# Patient Record
Sex: Male | Born: 1990 | Race: White | Hispanic: No | Marital: Married | Smoking: Never smoker
Health system: Southern US, Community
[De-identification: ages and names within clinical notes are randomized; demographics above are authoritative.]

## PROBLEM LIST (undated history)

## (undated) DIAGNOSIS — J45909 Unspecified asthma, uncomplicated: Secondary | ICD-10-CM

---

## 2017-09-23 ENCOUNTER — Emergency Department (INDEPENDENT_AMBULATORY_CARE_PROVIDER_SITE_OTHER): Payer: 59

## 2017-09-23 ENCOUNTER — Encounter: Payer: Self-pay | Admitting: *Deleted

## 2017-09-23 ENCOUNTER — Emergency Department: Payer: 59

## 2017-09-23 ENCOUNTER — Emergency Department (INDEPENDENT_AMBULATORY_CARE_PROVIDER_SITE_OTHER)
Admission: EM | Admit: 2017-09-23 | Discharge: 2017-09-23 | Disposition: A | Discharge status: Home / Self Care | Payer: 59 | Source: Home / Self Care | Attending: Family Medicine | Admitting: Family Medicine

## 2017-09-23 ENCOUNTER — Other Ambulatory Visit: Payer: Self-pay

## 2017-09-23 DIAGNOSIS — M79661 Pain in right lower leg: Secondary | ICD-10-CM

## 2017-09-23 DIAGNOSIS — S8251XA Displaced fracture of medial malleolus of right tibia, initial encounter for closed fracture: Secondary | ICD-10-CM

## 2017-09-23 DIAGNOSIS — X58XXXA Exposure to other specified factors, initial encounter: Secondary | ICD-10-CM

## 2017-09-23 HISTORY — DX: Unspecified asthma, uncomplicated: J45.909

## 2017-09-23 NOTE — Discharge Instructions (Signed)
°  You may take 500mg  acetaminophen every 4-6 hours or in combination with ibuprofen 400-600mg  every 6-8 hours as needed for pain and inflammation.  You may remove the boot during the day to apply a cool compress 2-3 times daily and to bath.  Please follow up with Family Medicine or Sports Medicine in 2-3 weeks if not improving, sooner if worsening.

## 2017-09-23 NOTE — ED Triage Notes (Signed)
Pt c/o RT lower leg pain x 2 days after kicking a football. He applied ice.

## 2017-09-23 NOTE — ED Provider Notes (Signed)
[facility]    CSN: [id number] Arrival date & time: 09/23/17  [id number]     History   Chief Complaint Chief Complaint  Patient presents with  . Leg Pain    HPI Sean Russo is a 27 y.o. male.   HPI  Sean Russo is a 27 y.o. male presenting to UC with c/o Right anterior lower leg pain and Right ankle pain that started 2 days ago after kicking a football. He believes he twisted his leg some during the kick.  Pain is aching and sore, worse with ambulation.  He applied ice with mild temporary relief.  He has not taken any pain medications. No other injuries.    Past Medical History:  Diagnosis Date  . Asthma     There are no active problems to display for this patient.   History reviewed. No pertinent surgical history.     Home Medications    Prior to Admission medications   Medication Sig Start Date End Date Taking? Authorizing Provider  albuterol (PROAIR HFA) 108 (90 Base) MCG/ACT inhaler Inhale into the lungs. 09/18/16  Yes [provider]    Family History History reviewed. No pertinent family history.  Social History Social History   Tobacco Use  . Smoking status: Never Smoker  . Smokeless tobacco: Never Used  Substance Use Topics  . Alcohol use: Never    Frequency: Never  . Drug use: Never     Allergies   Shellfish allergy; Amoxicillin; and Sulfa antibiotics   Review of Systems Review of Systems  Musculoskeletal: Positive for arthralgias and myalgias. Negative for gait problem and joint swelling.  Skin: Negative for color change and wound.  Neurological: Negative for weakness and numbness.     Physical Exam Triage Vital Signs ED Triage Vitals  Enc Vitals Group     BP 09/23/17 0844 (!) 146/74     Pulse Rate 09/23/17 0844 (!) 46     Resp 09/23/17 0844 16     Temp 09/23/17 0844 98.3 F (36.8 C)     Temp Source 09/23/17 0844 Oral     SpO2 09/23/17 0844 100 %     Weight 09/23/17 0845 182 lb (82.6 kg)     Height  09/23/17 0845 5\' 8"  (1.727 m)     Head Circumference --      Peak Flow --      Pain Score 09/23/17 0845 4     Pain Loc --      Pain Edu? --      Excl. in GC? --    No data found.  Updated Vital Signs BP (!) 146/74 (BP Location: Right Arm)   Pulse (!) 46   Temp 98.3 F (36.8 C) (Oral)   Resp 16   Ht 5\' 8"  (1.727 m)   Wt 182 lb (82.6 kg)   SpO2 100%   BMI 27.67 kg/m   Visual Acuity Right Eye Distance:   Left Eye Distance:   Bilateral Distance:    Right Eye Near:   Left Eye Near:    Bilateral Near:     Physical Exam  Constitutional: He is oriented to person, place, and time. He appears well-developed and well-nourished.  HENT:  Head: Normocephalic and atraumatic.  Eyes: EOM are normal.  Neck: Normal range of motion.  Cardiovascular: Bradycardia present.  Pulmonary/Chest: Effort normal. No respiratory distress.  Musculoskeletal: Normal range of motion. He exhibits tenderness. He exhibits no edema.  Right leg: no obvious edema. Full ROM knee  and ankle. Tenderness to anterior proximal anterior tibialis muscle. Calf is soft, non-tender. Right ankle: mild tenderness to lateral aspect. Full ROM  Neurological: He is alert and oriented to person, place, and time.  Skin: Skin is warm and dry.  Right leg: skin in tact. No ecchymosis or erythema.  Psychiatric: He has a normal mood and affect. His behavior is normal.  Nursing note and vitals reviewed.    UC Treatments / Results  Labs (all labs ordered are listed, but only abnormal results are displayed) Labs Reviewed - No data to display  EKG None  Radiology Dg Tibia/fibula Right  Result Date: 09/23/2017 CLINICAL DATA:  Right lower leg and ankle pain and tenderness after kicking football 2 days ago EXAM: RIGHT TIBIA AND FIBULA - 2 VIEW COMPARISON:  None. FINDINGS: There is a small bone fragment noted adjacent to the medial malleolus, likely small avulsed fragment. No additional acute bony abnormality. No subluxation or  dislocation. IMPRESSION: Avulsion fracture off the tip of the medial malleolus. Electronically Signed   By: Charlett Nose M.D.   On: 09/23/2017 09:41   Dg Ankle Complete Right  Result Date: 09/23/2017 CLINICAL DATA:  Right lower leg pain EXAM: RIGHT ANKLE - COMPLETE 3+ VIEW COMPARISON:  None. FINDINGS: Irregular bone fragment noted adjacent to the medial malleolus, compatible with small avulsed fragment. No additional acute bony abnormality. No subluxation or dislocation. IMPRESSION: Small avulsed fragment at the tip of the medial malleolus. Electronically Signed   By: Charlett Nose M.D.   On: 09/23/2017 09:40   US Venous Img Lower Unilateral Right  Result Date: 09/23/2017 CLINICAL DATA:  Leg pain following kicking football 2 days ago, initial encounter EXAM: RIGHT LOWER EXTREMITY VENOUS DOPPLER ULTRASOUND TECHNIQUE: Gray-scale sonography with graded compression, as well as color Doppler and duplex ultrasound were performed to evaluate the lower extremity deep venous systems from the level of the common femoral vein and including the common femoral, femoral, profunda femoral, popliteal and calf veins including the posterior tibial, peroneal and gastrocnemius veins when visible. The superficial great saphenous vein was also interrogated. Spectral Doppler was utilized to evaluate flow at rest and with distal augmentation maneuvers in the common femoral, femoral and popliteal veins. COMPARISON:  None. FINDINGS: Contralateral Common Femoral Vein: Respiratory phasicity is normal and symmetric with the symptomatic side. No evidence of thrombus. Normal compressibility. Common Femoral Vein: No evidence of thrombus. Normal compressibility, respiratory phasicity and response to augmentation. Saphenofemoral Junction: No evidence of thrombus. Normal compressibility and flow on color Doppler imaging. Profunda Femoral Vein: No evidence of thrombus. Normal compressibility and flow on color Doppler imaging. Femoral Vein: No  evidence of thrombus. Normal compressibility, respiratory phasicity and response to augmentation. Popliteal Vein: No evidence of thrombus. Normal compressibility, respiratory phasicity and response to augmentation. Calf Veins: No evidence of thrombus. Normal compressibility and flow on color Doppler imaging. Superficial Great Saphenous Vein: No evidence of thrombus. Normal compressibility. Venous Reflux:  None. Other Findings:  None. IMPRESSION: No evidence of deep venous thrombosis. Electronically Signed   By: Alcide Clever M.D.   On: 09/23/2017 09:33    Procedures Procedures (including critical care time)  Medications Ordered in UC Medications - No data to display  Initial Impression / Assessment and Plan / UC Course  I have reviewed the triage vital signs and the nursing notes.  Pertinent labs & imaging results that were available during my care of the patient were reviewed by me and considered in my medical decision making (see chart  for details).     Hx and exam c/w closed avulsion fracture of medial malleolus  Walking boot applied, crutches provided for comfort. Home instructions provided.   Final Clinical Impressions(s) / UC Diagnoses   Final diagnoses:  Avulsion fracture of medial malleolus of right tibia, closed, initial encounter  Pain in right shin     Discharge Instructions      You may take 500mg  acetaminophen every 4-6 hours or in combination with ibuprofen 400-600mg  every 6-8 hours as needed for pain and inflammation.  You may remove the boot during the day to apply a cool compress 2-3 times daily and to bath.  Please follow up with Family Medicine or Sports Medicine in 2-3 weeks if not improving, sooner if worsening.    ED Prescriptions    None     Controlled Substance Prescriptions [place] Controlled Substance Registry consulted? Not Applicable   Rolla Plate 09/23/17 1005

## 2018-10-17 IMAGING — US US EXTREM LOW VENOUS*R*
1 series · 13 of 24 positions shown · non-contrast
Comparison: None.

CLINICAL DATA: Leg pain following kicking football 2 days ago,
initial encounter



[Series 1: us extrem low venous*right* · 0.06mm/px · 13 of 28 slices shown]
[im 1/28]
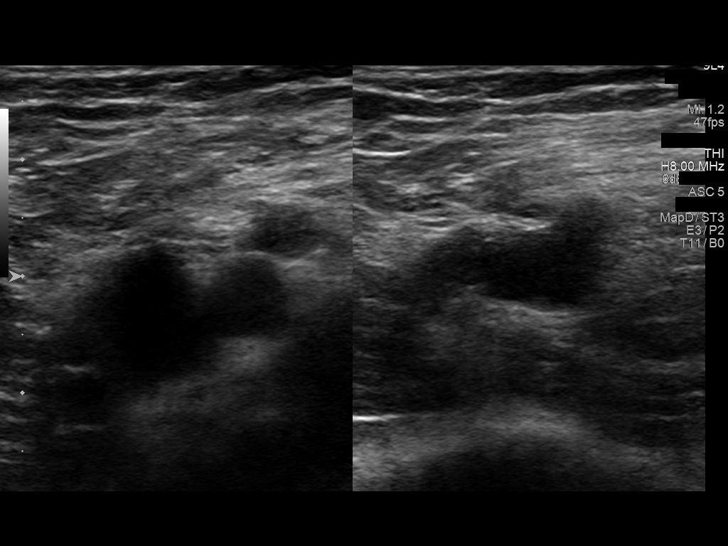
[im 3/28]
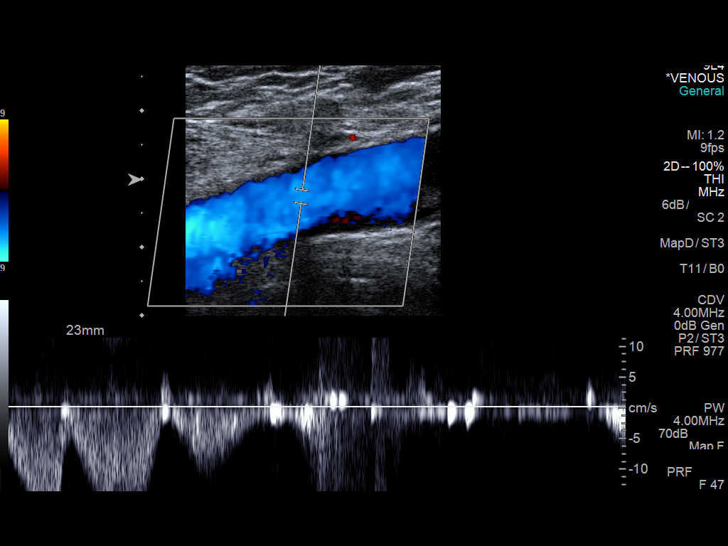
[im 5/28]
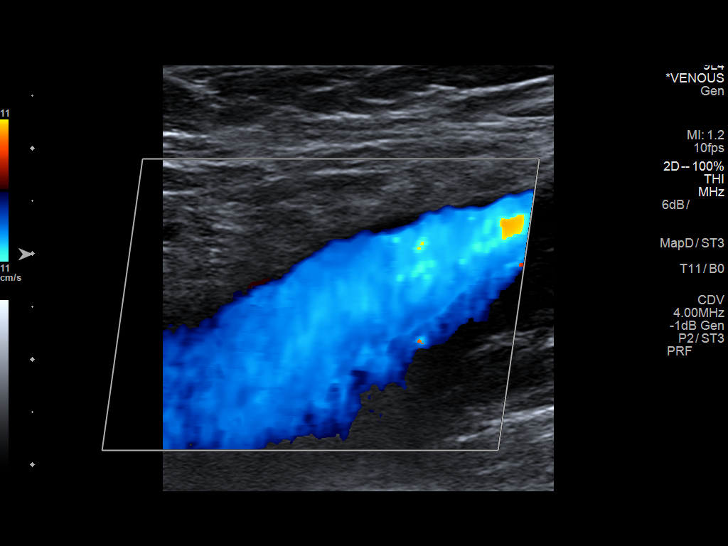
[im 8/28]
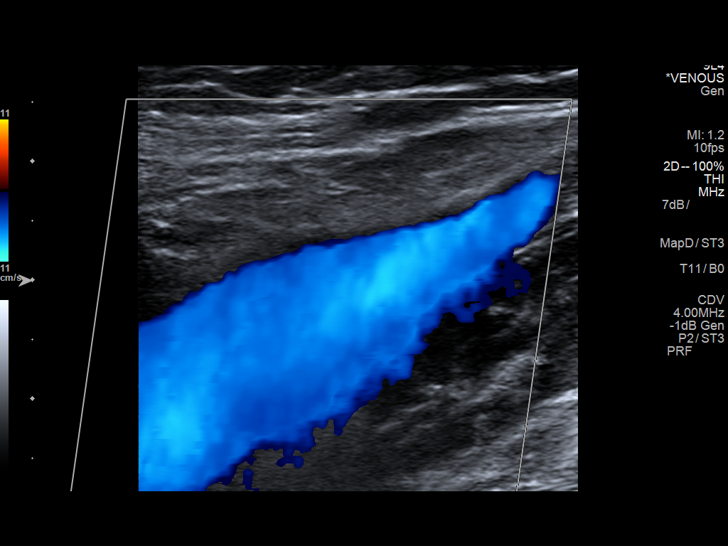
[im 10/28]
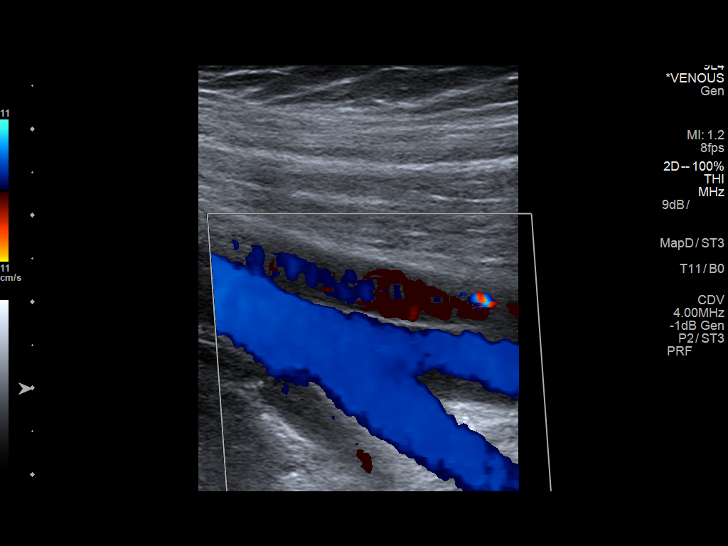
[im 12/28]
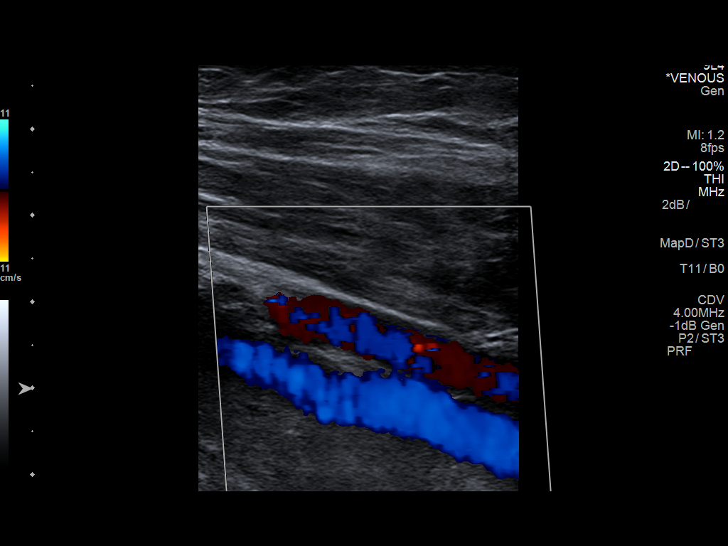
[im 15/28]
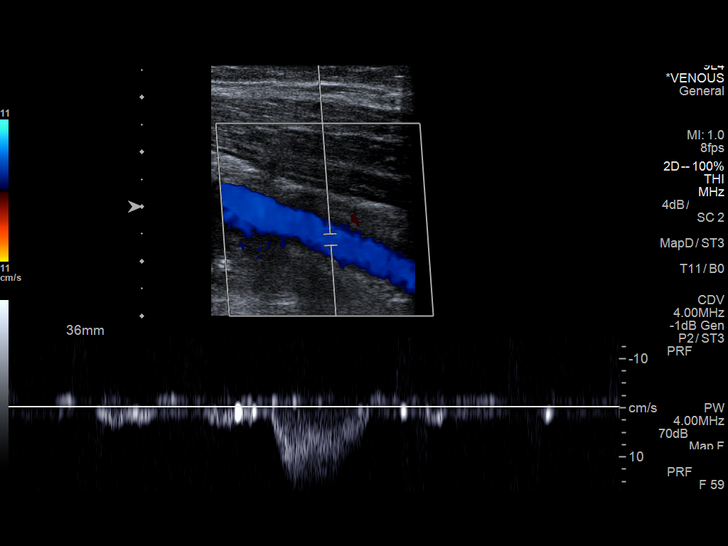
[im 16/28]
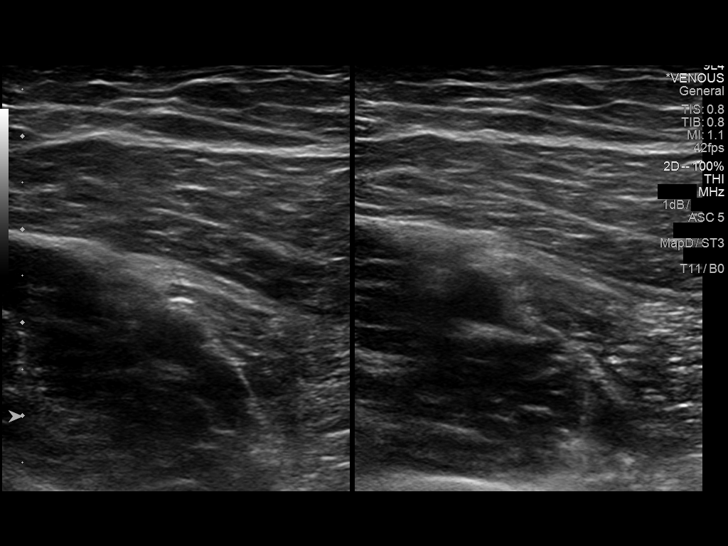
[im 18/28]
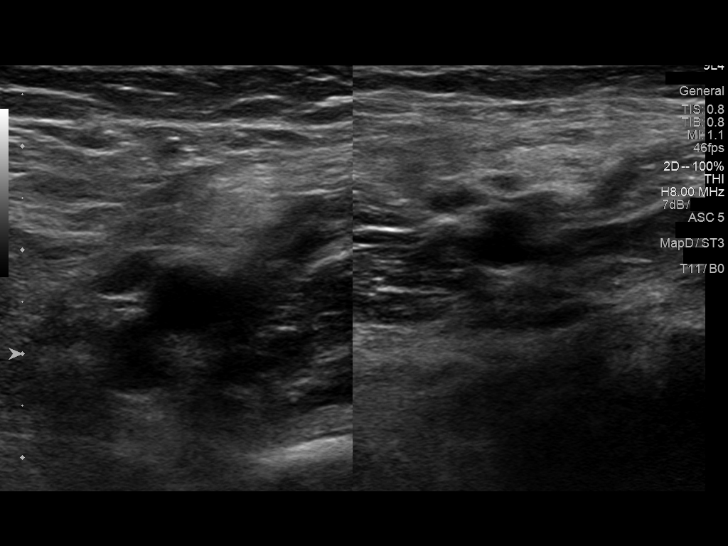
[im 20/28]
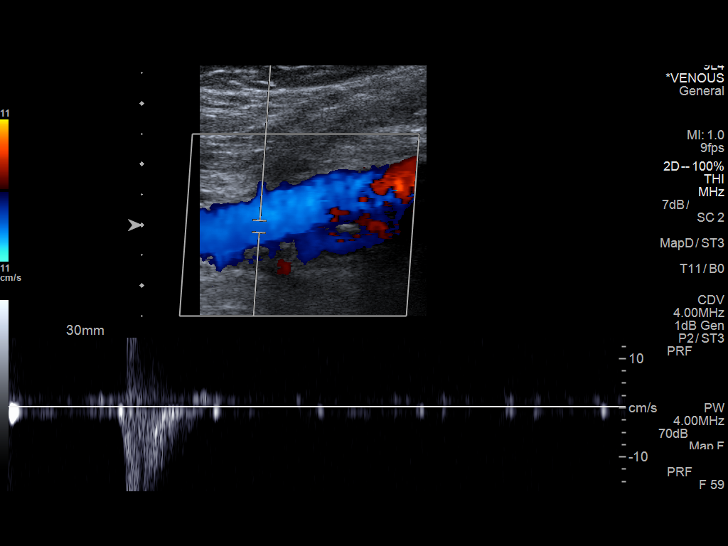
[im 23/28]
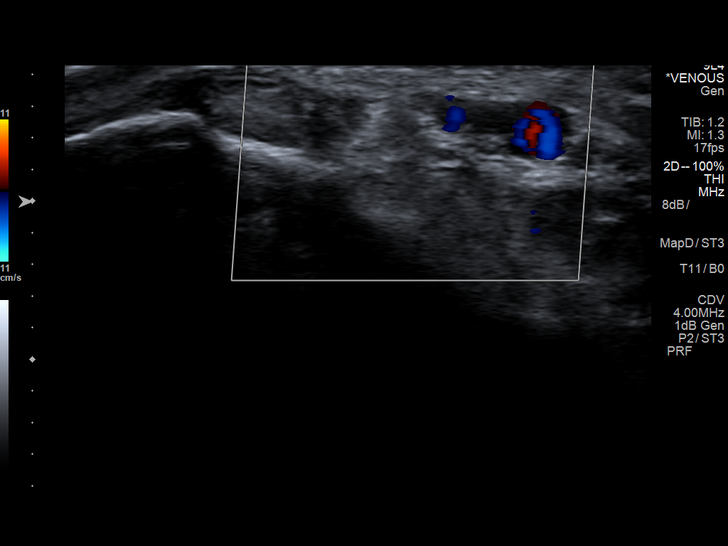
[im 25/28]
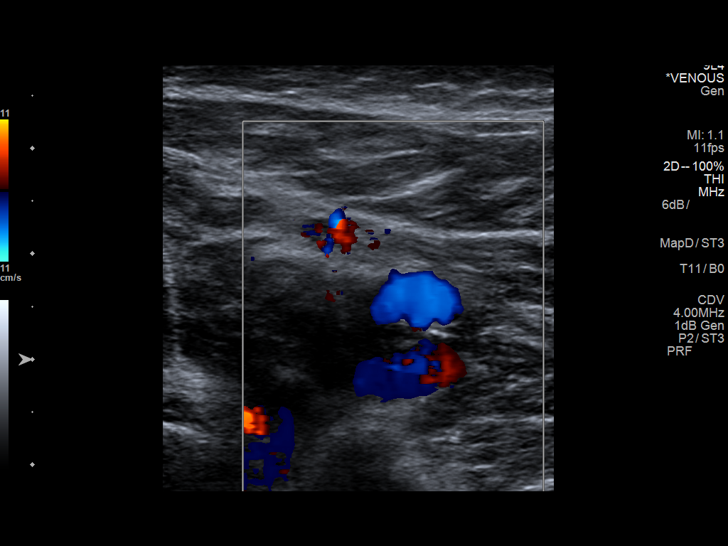
[im 28/28]
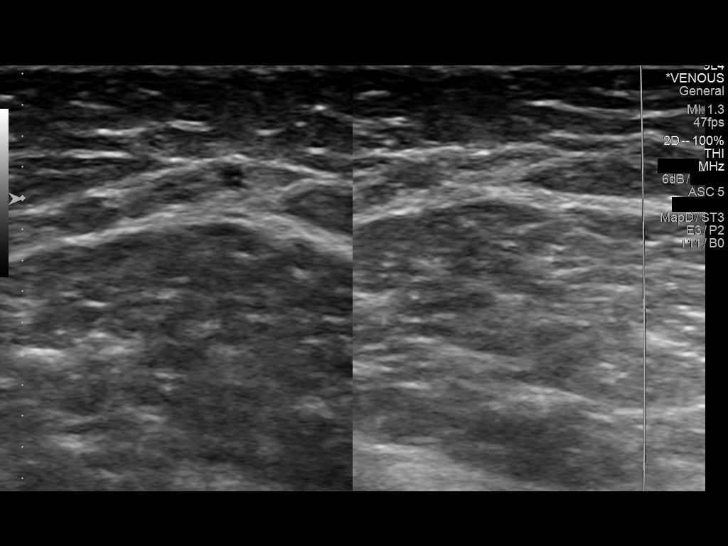

[13 of 24 positions shown; findings below may reference images not displayed]

FINDINGS: Contralateral Common Femoral Vein: Respiratory phasicity is normal
and symmetric with the symptomatic side. No evidence of thrombus.
Normal compressibility.

Common Femoral Vein: No evidence of thrombus. Normal
compressibility, respiratory phasicity and response to augmentation.

Saphenofemoral Junction: No evidence of thrombus. Normal
compressibility and flow on color Doppler imaging.

Profunda Femoral Vein: No evidence of thrombus. Normal
compressibility and flow on color Doppler imaging.

Femoral Vein: No evidence of thrombus. Normal compressibility,
respiratory phasicity and response to augmentation.

Popliteal Vein: No evidence of thrombus. Normal compressibility,
respiratory phasicity and response to augmentation.

Calf Veins: No evidence of thrombus. Normal compressibility and flow
on color Doppler imaging.

Superficial Great Saphenous Vein: No evidence of thrombus. Normal
compressibility.

Venous Reflux:  None.

Other Findings:  None.
IMPRESSION: No evidence of deep venous thrombosis.

## 2019-09-12 IMAGING — DX DG TIBIA/FIBULA 2V*R*
3 series · 3 of 3 positions shown · non-contrast
Comparison: None.

CLINICAL DATA: Right lower leg and ankle pain and tenderness after
kicking football 2 days ago

EXAM:
RIGHT TIBIA AND FIBULA - 2 VIEW

[ankle ap]
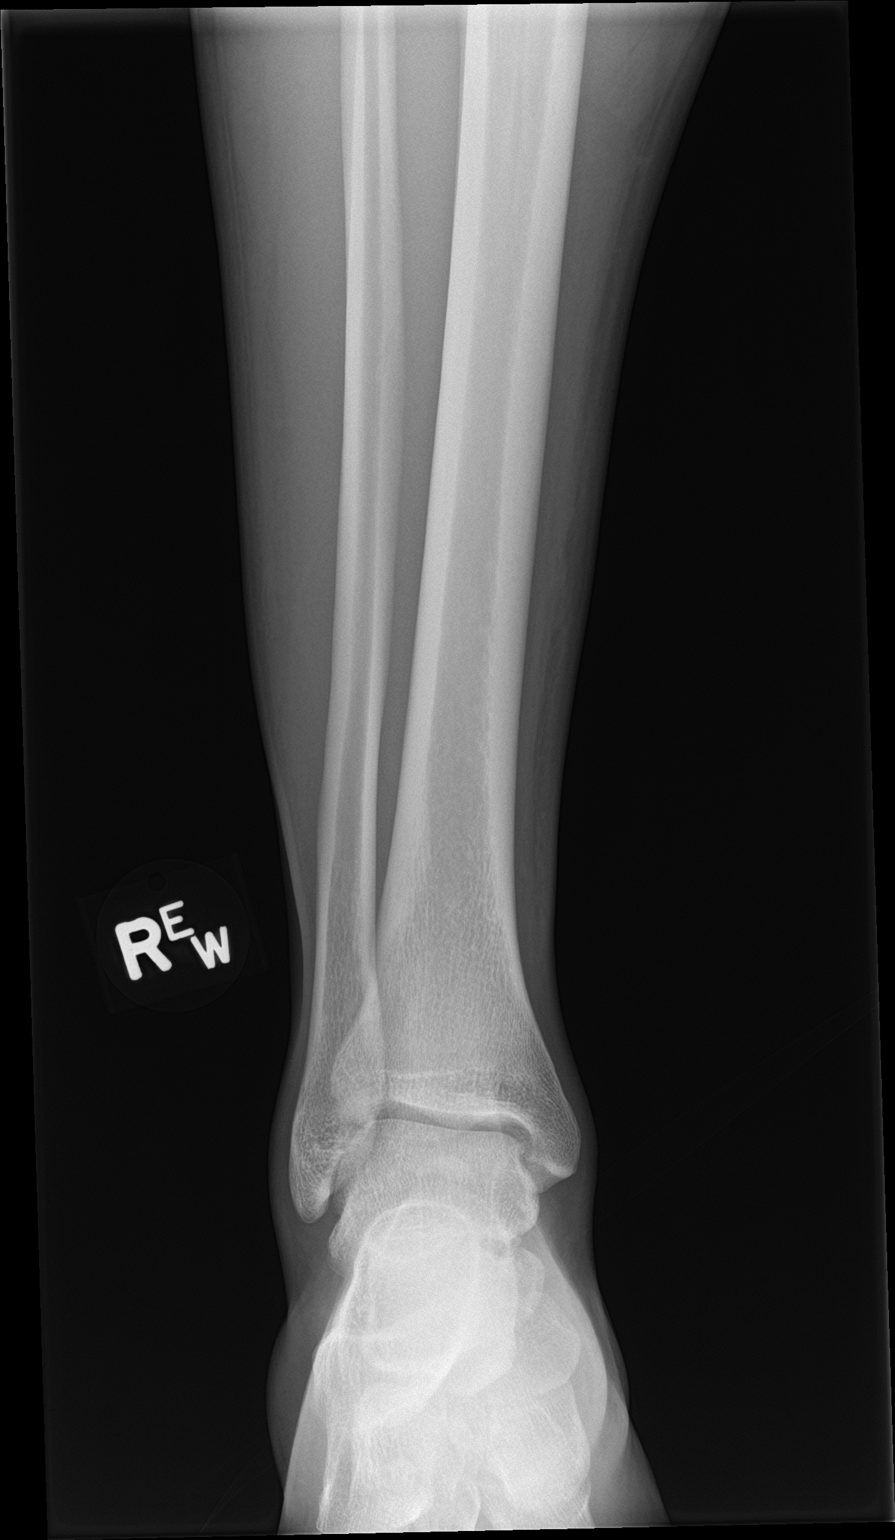

[ankle obl]
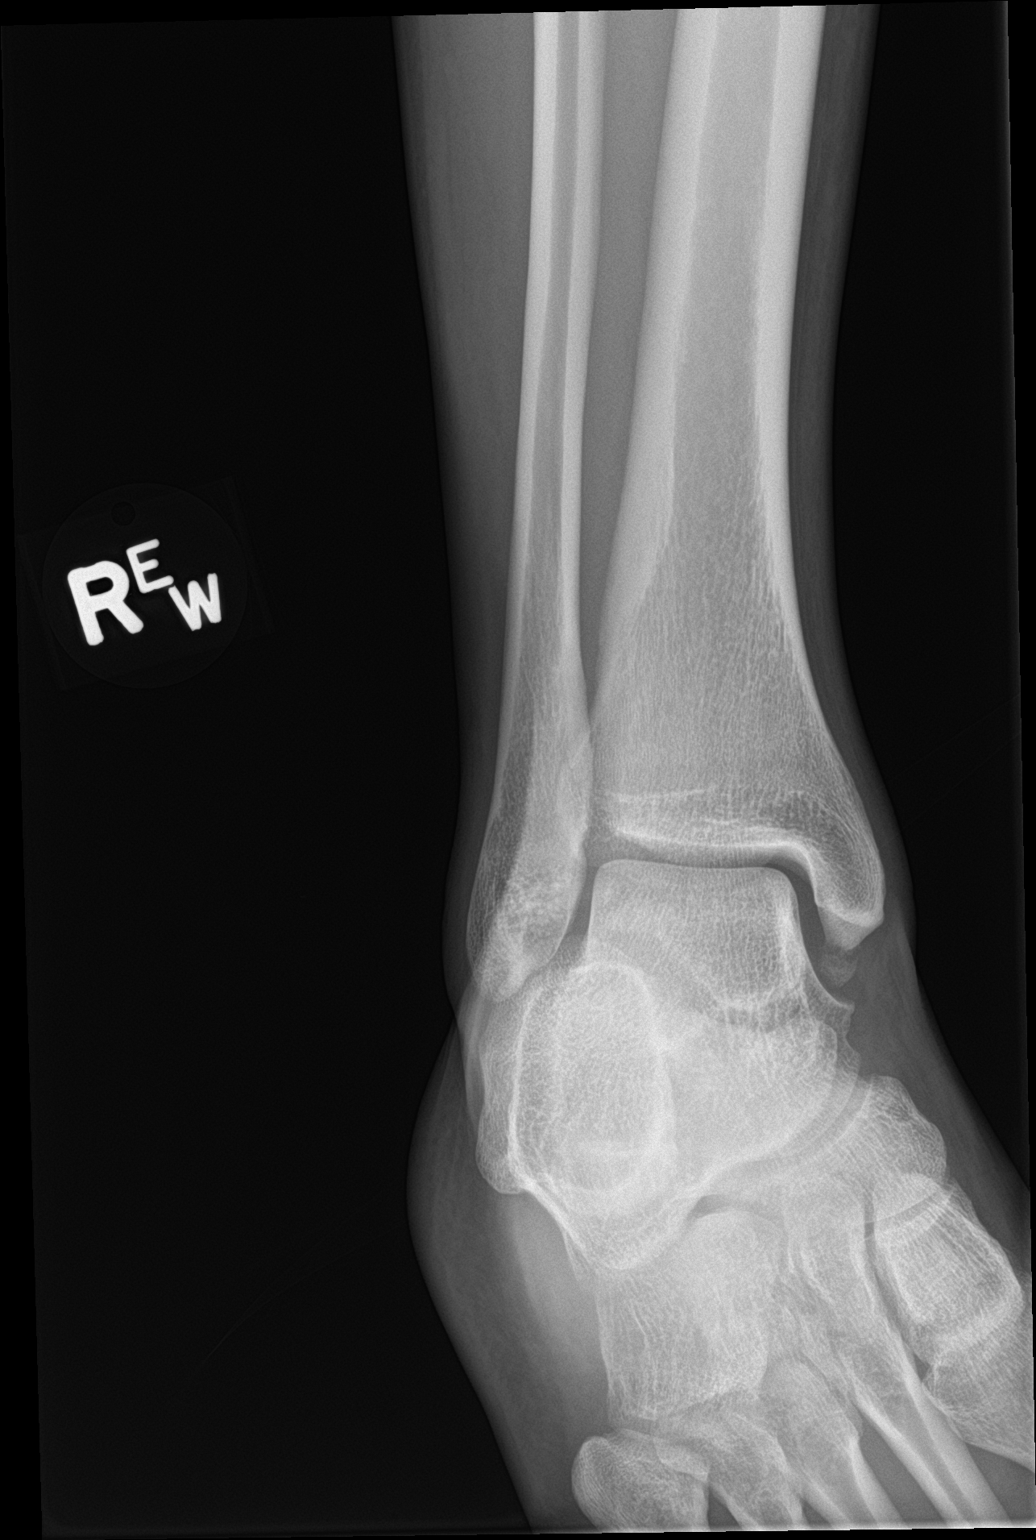

[ankle lat]
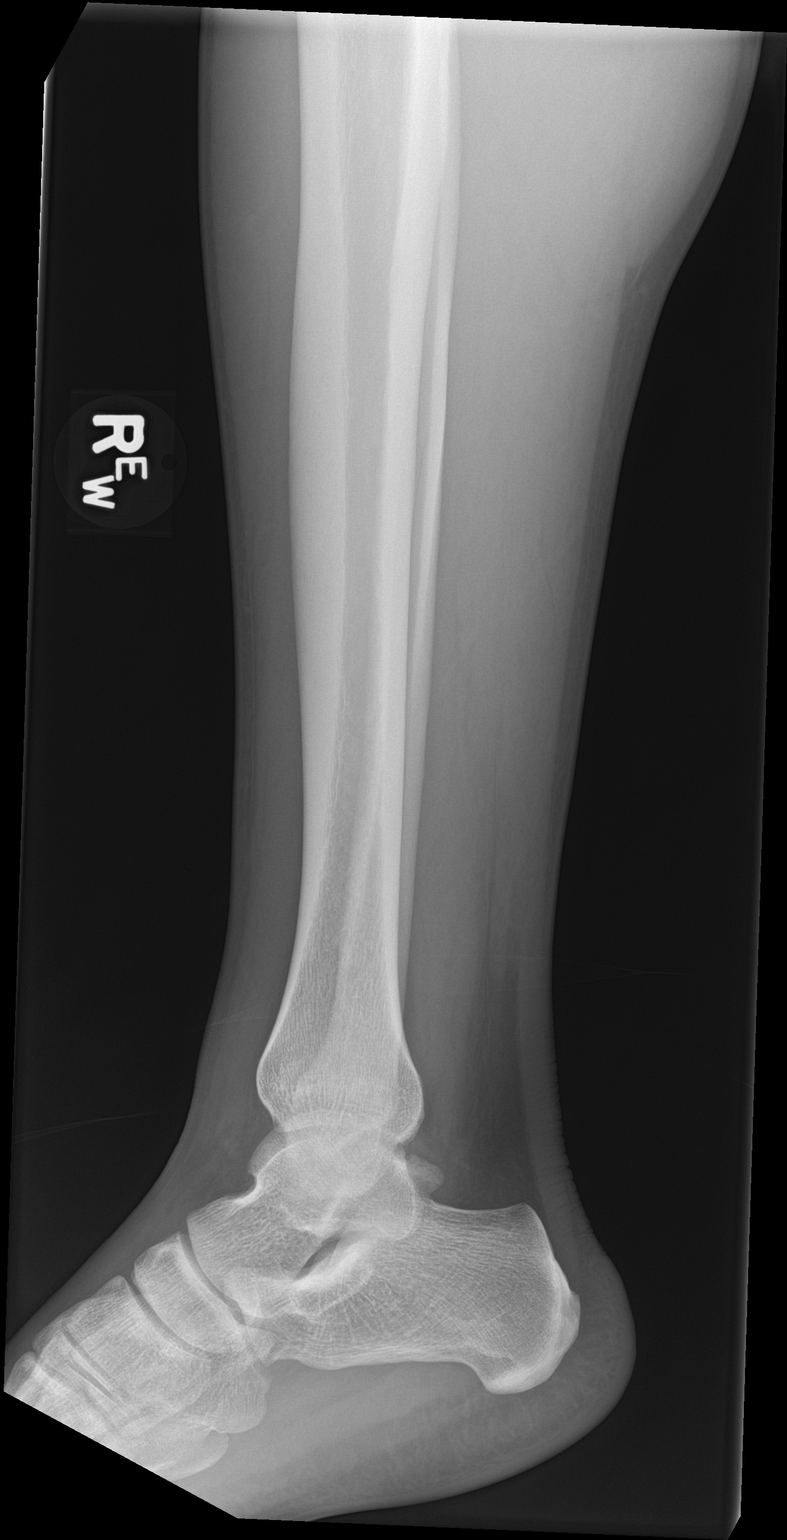

[3 of 3 positions shown; findings below may reference images not displayed]

FINDINGS: There is a small bone fragment noted adjacent to the medial
malleolus, likely small avulsed fragment. No additional acute bony
abnormality. No subluxation or dislocation.
IMPRESSION: Avulsion fracture off the tip of the medial malleolus.

## 2020-07-03 ENCOUNTER — Ambulatory Visit: Payer: 59 | Admitting: Podiatry

## 2020-07-04 ENCOUNTER — Encounter: Payer: Self-pay | Admitting: Podiatry

## 2020-07-04 ENCOUNTER — Other Ambulatory Visit: Payer: Self-pay

## 2020-07-04 ENCOUNTER — Ambulatory Visit: Payer: 59 | Admitting: Podiatry

## 2020-07-04 DIAGNOSIS — L600 Ingrowing nail: Secondary | ICD-10-CM | POA: Diagnosis not present

## 2020-07-05 ENCOUNTER — Encounter: Payer: Self-pay | Admitting: Podiatry

## 2020-07-05 NOTE — Progress Notes (Signed)
  Subjective:  Patient ID: Sean Russo, male    DOB: 1990/05/25,  MRN: [id number]  Chief Complaint  Patient presents with  . Ingrown Toenail    Left hallux ingrown     30 y.o. male presents with the above complaint.  Patient presents with complaint left hallux medial border ingrown.  Patient states is painful to touch.  Patient would like to have it removed.  He has not had an ingrown removed in the past.  He states that he is not allergic to Betadine.  He denies any other acute complaints.  He has not seen anyone else prior to seeing me.  He would like to discuss all the treatment options that are available to him.   Review of Systems: Negative except as noted in the HPI. Denies N/V/F/Ch.  Past Medical History:  Diagnosis Date  . Asthma     Current Outpatient Medications:  .  albuterol (PROAIR HFA) 108 (90 Base) MCG/ACT inhaler, Inhale into the lungs., Disp: , Rfl:   Social History   Tobacco Use  Smoking Status Never Smoker  Smokeless Tobacco Never Used    Allergies  Allergen Reactions  . Shellfish Allergy Itching and Rash  . Amoxicillin Rash  . Sulfa Antibiotics Other (See Comments)   Objective:  There were no vitals filed for this visit. There is no height or weight on file to calculate BMI. Constitutional Well developed. Well nourished.  Vascular Dorsalis pedis pulses palpable bilaterally. Posterior tibial pulses palpable bilaterally. Capillary refill normal to all digits.  No cyanosis or clubbing noted. Pedal hair growth normal.  Neurologic Normal speech. Oriented to person, place, and time. Epicritic sensation to light touch grossly present bilaterally.  Dermatologic Painful ingrowing nail at medial nail borders of the hallux nail left. No other open wounds. No skin lesions.  Orthopedic: Normal joint ROM without pain or crepitus bilaterally. No visible deformities. No bony tenderness.   Radiographs: None Assessment:   1. Ingrown left big toenail     Plan:  Patient was evaluated and treated and all questions answered.  Ingrown Nail, left -Patient elects to proceed with minor surgery to remove ingrown toenail removal today. Consent reviewed and signed by patient. -Ingrown nail excised. See procedure note. -Educated on post-procedure care including soaking. Written instructions provided and reviewed. -Patient to follow up in 2 weeks for nail check.  Procedure: Excision of Ingrown Toenail Location: Left 1st toe medial nail borders. Anesthesia: Lidocaine 1% plain; 1.5 mL and Marcaine 0.5% plain; 1.5 mL, digital block. Skin Prep: Betadine. Dressing: Silvadene; telfa; dry, sterile, compression dressing. Technique: Following skin prep, the toe was exsanguinated and a tourniquet was secured at the base of the toe. The affected nail border was freed, split with a nail splitter, and excised. Chemical matrixectomy was then performed with phenol and irrigated out with alcohol. The tourniquet was then removed and sterile dressing applied. Disposition: Patient tolerated procedure well. Patient to return in 2 weeks for follow-up.   No follow-ups on file.

## 2022-08-22 ENCOUNTER — Encounter: Payer: Self-pay | Admitting: Podiatry

## 2022-08-22 ENCOUNTER — Ambulatory Visit (INDEPENDENT_AMBULATORY_CARE_PROVIDER_SITE_OTHER): Payer: No Typology Code available for payment source | Admitting: Podiatry

## 2022-08-22 DIAGNOSIS — L600 Ingrowing nail: Secondary | ICD-10-CM | POA: Diagnosis not present

## 2022-08-22 NOTE — Patient Instructions (Signed)

## 2022-08-25 NOTE — Progress Notes (Signed)
Subjective:   Patient ID: Sean Russo, male   DOB: 32 y.o.   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  At this point I have recommended nail removal and I explained the procedure risk of nail removal and he wants to have this done.  I allowed him to read consent form understanding risk and then he signed and I infiltrated the left big toe 60 mg Xylocaine Marcaine mixture sterile prep done using sterile instrumentation remove the hallux nail exposed matrix applied phenol 5 applications 30 seconds followed by alcohol lavage sterile dressing gave instructions on soaks and wear dressing 24 hours take it off earlier if throbbing were to occur

## 2022-09-03 ENCOUNTER — Ambulatory Visit: Payer: No Typology Code available for payment source | Admitting: Podiatry
